# Patient Record
Sex: Male | Born: 2000 | Hispanic: No | Marital: Single | Smoking: Current every day smoker
Health system: Southern US, Community
[De-identification: ages and names within clinical notes are randomized; demographics above are authoritative.]

---
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
## 2022-08-02 ENCOUNTER — Emergency Department (HOSPITAL_COMMUNITY): Payer: Medicaid Other | Admitting: Anesthesiology

## 2022-08-02 ENCOUNTER — Encounter (HOSPITAL_BASED_OUTPATIENT_CLINIC_OR_DEPARTMENT_OTHER): Payer: Self-pay | Admitting: Emergency Medicine

## 2022-08-02 ENCOUNTER — Encounter (HOSPITAL_COMMUNITY)
Admission: EM | Discharge status: Against Medical Advice | Payer: Self-pay | Source: Home / Self Care | Attending: Emergency Medicine

## 2022-08-02 ENCOUNTER — Emergency Department (HOSPITAL_BASED_OUTPATIENT_CLINIC_OR_DEPARTMENT_OTHER): Payer: Medicaid Other

## 2022-08-02 ENCOUNTER — Ambulatory Visit (HOSPITAL_BASED_OUTPATIENT_CLINIC_OR_DEPARTMENT_OTHER)
Admission: EM | Admit: 2022-08-02 | Discharge: 2022-08-02 | Discharge status: Against Medical Advice | Payer: Medicaid Other | Attending: Emergency Medicine | Admitting: Emergency Medicine

## 2022-08-02 ENCOUNTER — Other Ambulatory Visit: Payer: Self-pay

## 2022-08-02 DIAGNOSIS — Z539 Procedure and treatment not carried out, unspecified reason: Secondary | ICD-10-CM | POA: Diagnosis not present

## 2022-08-02 DIAGNOSIS — F17210 Nicotine dependence, cigarettes, uncomplicated: Secondary | ICD-10-CM | POA: Diagnosis not present

## 2022-08-02 DIAGNOSIS — K353 Acute appendicitis with localized peritonitis, without perforation or gangrene: Secondary | ICD-10-CM | POA: Insufficient documentation

## 2022-08-02 DIAGNOSIS — K3580 Unspecified acute appendicitis: Secondary | ICD-10-CM | POA: Insufficient documentation

## 2022-08-02 LAB — COMPREHENSIVE METABOLIC PANEL
ALT: 25 U/L (ref 0–44)
AST: 20 U/L (ref 15–41)
Albumin: 4.7 g/dL (ref 3.5–5.0)
Alkaline Phosphatase: 97 U/L (ref 38–126)
Anion gap: 10 (ref 5–15)
BUN: 12 mg/dL (ref 6–20)
CO2: 24 mmol/L (ref 22–32)
Calcium: 9.5 mg/dL (ref 8.9–10.3)
Chloride: 103 mmol/L (ref 98–111)
Creatinine, Ser: 0.99 mg/dL (ref 0.61–1.24)
GFR, Estimated: >60 mL/min (ref >60)
Glucose, Bld: 120 mg/dL — ABNORMAL HIGH (ref 70–99)
Potassium: 3.9 mmol/L (ref 3.5–5.1)
Sodium: 137 mmol/L (ref 135–145)
Total Bilirubin: 1.1 mg/dL (ref 0.3–1.2)
Total Protein: 7.9 g/dL (ref 6.5–8.1)

## 2022-08-02 LAB — URINALYSIS, ROUTINE W REFLEX MICROSCOPIC
Bilirubin Urine: NEGATIVE
Glucose, UA: NEGATIVE mg/dL
Hgb urine dipstick: NEGATIVE
Ketones, ur: NEGATIVE mg/dL
Leukocytes,Ua: NEGATIVE
Nitrite: NEGATIVE
Protein, ur: 30 mg/dL — AB
Specific Gravity, Urine: <=1.005 (ref 1.005–1.030)
pH: 6 (ref 5.0–8.0)

## 2022-08-02 LAB — CBC
HCT: 43.7 % (ref 39.0–52.0)
Hemoglobin: 15.2 g/dL (ref 13.0–17.0)
MCH: 30.2 pg (ref 26.0–34.0)
MCHC: 34.8 g/dL (ref 30.0–36.0)
MCV: 86.7 fL (ref 80.0–100.0)
Platelets: 253 10*3/uL (ref 150–400)
RBC: 5.04 MIL/uL (ref 4.22–5.81)
RDW: 12.9 % (ref 11.5–15.5)
WBC: 21.4 10*3/uL — ABNORMAL HIGH (ref 4.0–10.5)
nRBC: 0 % (ref 0.0–0.2)

## 2022-08-02 LAB — URINALYSIS, MICROSCOPIC (REFLEX)

## 2022-08-02 LAB — LIPASE, BLOOD: Lipase: 25 U/L (ref 11–51)

## 2022-08-02 SURGERY — APPENDECTOMY, LAPAROSCOPIC
Anesthesia: General

## 2022-08-02 MED ORDER — ONDANSETRON HCL 4 MG/2ML IJ SOLN
4.0000 mg | Freq: Once | INTRAMUSCULAR | Status: AC
  Administered 2022-08-02: 4 mg via INTRAVENOUS
  Filled 2022-08-02: qty 2

## 2022-08-02 MED ORDER — IOHEXOL 300 MG/ML  SOLN
100.0000 mL | Freq: Once | INTRAMUSCULAR | Status: AC | PRN
  Administered 2022-08-02: 100 mL via INTRAVENOUS

## 2022-08-02 MED ORDER — GABAPENTIN 300 MG PO CAPS
ORAL_CAPSULE | ORAL | Status: AC
  Filled 2022-08-02: qty 1

## 2022-08-02 MED ORDER — FENTANYL CITRATE (PF) 250 MCG/5ML IJ SOLN
INTRAMUSCULAR | Status: AC
  Filled 2022-08-02: qty 5

## 2022-08-02 MED ORDER — SODIUM CHLORIDE 0.9 % IV SOLN: 2.0000 g | INTRAVENOUS | Status: DC

## 2022-08-02 MED ORDER — MORPHINE SULFATE (PF) 2 MG/ML IV SOLN
2.0000 mg | Freq: Once | INTRAVENOUS | Status: AC
  Administered 2022-08-02: 2 mg via INTRAVENOUS
  Filled 2022-08-02: qty 1

## 2022-08-02 MED ORDER — ACETAMINOPHEN 10 MG/ML IV SOLN
INTRAVENOUS | Status: AC
  Filled 2022-08-02: qty 100

## 2022-08-02 MED ORDER — METRONIDAZOLE 500 MG/100ML IV SOLN
500.0000 mg | Freq: Once | INTRAVENOUS | Status: AC
  Administered 2022-08-02: 500 mg via INTRAVENOUS
  Filled 2022-08-02: qty 100

## 2022-08-02 MED ORDER — SODIUM CHLORIDE 0.9 % IV SOLN: INTRAVENOUS | Status: DC | PRN

## 2022-08-02 MED ORDER — MORPHINE SULFATE (PF) 4 MG/ML IV SOLN
4.0000 mg | Freq: Once | INTRAVENOUS | Status: AC
  Administered 2022-08-02: 4 mg via INTRAVENOUS
  Filled 2022-08-02: qty 1

## 2022-08-02 MED ORDER — SODIUM CHLORIDE 0.9 % IV SOLN
2.0000 g | Freq: Once | INTRAVENOUS | Status: AC
  Administered 2022-08-02: 2 g via INTRAVENOUS
  Filled 2022-08-02: qty 20

## 2022-08-02 MED ORDER — LACTATED RINGERS IV BOLUS
1000.0000 mL | Freq: Once | INTRAVENOUS | Status: AC
  Administered 2022-08-02: 1000 mL via INTRAVENOUS

## 2022-08-02 MED ORDER — GABAPENTIN 300 MG PO CAPS: 300.0000 mg | ORAL_CAPSULE | ORAL | Status: DC

## 2022-08-02 MED ORDER — MIDAZOLAM HCL 2 MG/2ML IJ SOLN
INTRAMUSCULAR | Status: AC
  Filled 2022-08-02: qty 2

## 2022-08-02 MED ORDER — CHLORHEXIDINE GLUCONATE CLOTH 2 % EX PADS
6.0000 | MEDICATED_PAD | Freq: Once | CUTANEOUS | Status: DC
  Filled 2022-08-02: qty 6

## 2022-08-02 MED ORDER — ACETAMINOPHEN 500 MG PO TABS: 1000.0000 mg | ORAL_TABLET | ORAL | Status: DC

## 2022-08-02 SURGICAL SUPPLY — 52 items
APPLIER CLIP 5 13 M/L LIGAMAX5 (MISCELLANEOUS)
APPLIER CLIP ROT 10 11.4 M/L (STAPLE)
BAG COUNTER SPONGE SURGICOUNT (BAG) IMPLANT
CABLE HIGH FREQUENCY MONO STRZ (ELECTRODE) ×1 IMPLANT
CHLORAPREP W/TINT 26 (MISCELLANEOUS) ×1 IMPLANT
CLIP APPLIE 5 13 M/L LIGAMAX5 (MISCELLANEOUS) IMPLANT
CLIP APPLIE ROT 10 11.4 M/L (STAPLE) IMPLANT
COVER SURGICAL LIGHT HANDLE (MISCELLANEOUS) ×1 IMPLANT
DEVICE TROCAR PUNCTURE CLOSURE (ENDOMECHANICALS) IMPLANT
DRAPE LAPAROSCOPIC ABDOMINAL (DRAPES) ×1 IMPLANT
DRAPE WARM FLUID 44X44 (DRAPES) ×1 IMPLANT
DRSG TEGADERM 2-3/8X2-3/4 SM (GAUZE/BANDAGES/DRESSINGS) ×2 IMPLANT
DRSG TEGADERM 4X4.75 (GAUZE/BANDAGES/DRESSINGS) ×1 IMPLANT
ELECT REM PT RETURN 15FT ADLT (MISCELLANEOUS) ×1 IMPLANT
ENDOLOOP SUT PDS II  0 18 (SUTURE)
ENDOLOOP SUT PDS II 0 18 (SUTURE) IMPLANT
GAUZE SPONGE 2X2 STRL 8-PLY (GAUZE/BANDAGES/DRESSINGS) ×1 IMPLANT
GLOVE ECLIPSE 8.0 STRL XLNG CF (GLOVE) ×1 IMPLANT
GLOVE INDICATOR 8.0 STRL GRN (GLOVE) ×1 IMPLANT
GOWN STRL REUS W/ TWL XL LVL3 (GOWN DISPOSABLE) ×1 IMPLANT
GOWN STRL REUS W/TWL XL LVL3 (GOWN DISPOSABLE) ×1
IRRIG SUCT STRYKERFLOW 2 WTIP (MISCELLANEOUS) ×1
IRRIGATION SUCT STRKRFLW 2 WTP (MISCELLANEOUS) ×1 IMPLANT
KIT BASIN OR (CUSTOM PROCEDURE TRAY) ×1 IMPLANT
KIT TURNOVER KIT A (KITS) IMPLANT
NEEDLE INSUFFLATION 14GA 120MM (NEEDLE) IMPLANT
PAD POSITIONING PINK XL (MISCELLANEOUS) ×1 IMPLANT
PENCIL SMOKE EVACUATOR (MISCELLANEOUS) IMPLANT
POUCH RETRIEVAL ECOSAC 10 (ENDOMECHANICALS) ×1 IMPLANT
POUCH RETRIEVAL ECOSAC 10MM (ENDOMECHANICALS) ×1
RELOAD STAPLER BLUE 60MM (STAPLE) IMPLANT
RELOAD STAPLER GREEN 60MM (STAPLE) IMPLANT
SCISSORS LAP 5X35 DISP (ENDOMECHANICALS) ×1 IMPLANT
SEALER TISSUE G2 STRG ARTC 35C (ENDOMECHANICALS) IMPLANT
SET TUBE SMOKE EVAC HIGH FLOW (TUBING) ×1 IMPLANT
SLEEVE Z-THREAD 5X100MM (TROCAR) ×1 IMPLANT
SPIKE FLUID TRANSFER (MISCELLANEOUS) ×1 IMPLANT
STAPLE ECHEON FLEX 60 POW ENDO (STAPLE) IMPLANT
STAPLER RELOAD BLUE 60MM (STAPLE)
STAPLER RELOAD GREEN 60MM (STAPLE)
SUT MNCRL AB 4-0 PS2 18 (SUTURE) ×1 IMPLANT
SUT PDS AB 0 CT1 36 (SUTURE) IMPLANT
SUT PDS AB 1 CT1 27 (SUTURE) IMPLANT
SUT SILK 2 0 SH (SUTURE) IMPLANT
TOWEL OR 17X26 10 PK STRL BLUE (TOWEL DISPOSABLE) ×1 IMPLANT
TRAY FOLEY MTR SLVR 14FR STAT (SET/KITS/TRAYS/PACK) ×1 IMPLANT
TRAY FOLEY MTR SLVR 16FR STAT (SET/KITS/TRAYS/PACK) IMPLANT
TRAY LAPAROSCOPIC (CUSTOM PROCEDURE TRAY) ×1 IMPLANT
TROCAR ADV FIXATION 12X100MM (TROCAR) IMPLANT
TROCAR ADV FIXATION 5X100MM (TROCAR) ×1 IMPLANT
TROCAR Z THREAD OPTICAL 12X100 (TROCAR) IMPLANT
TROCAR Z-THREAD OPTICAL 5X100M (TROCAR) ×1 IMPLANT

## 2022-08-02 NOTE — H&P (Deleted)
Kyle Cortez  Feb 18, 2001 [id number]  CARE TEAM:  PCP: [facility] [place]  Outpatient Care Team: Patient Care Team: [facility] [place] as PCP - General (Pediatrics)  Inpatient Treatment Team: Treatment Team: Consulting Physician: [facility]cs, Md, MD   This patient is a 22 y.o.male who presents today for surgical evaluation at the request of Kyle Cortez, [place] at [facility] ED.   Chief complaint / Reason for evaluation: Acute appendicitis.  22 year old male with no prior major medical issues otherwise healthy.  Noticed sharp abdominal pain when he woke up at 5:00 morning.  Became more intense.  Markedly decreased appetite.  No definite fevers or chills but felt nauseated with some vomiting.  Became more intense.  Was worried he was constipated.  Tried a laxative.  Worsening cramping pain.  Went to [facility].  History physical CT scan suspicious for appendicitis.  Surgical consultation requested.  Need to be transferred to a hospital that has surgery.  Took some time for him to leave [facility].  Apparently they were giving antibiotics.  Then patient went to ER.  Finally came to short stay.   Assessment  Kyle Cortez  22 y.o. male  Day of Surgery  Procedure(s): APPENDECTOMY LAPAROSCOPIC  Problem List:  Active Problems:   Acute appendicitis   History physical and CAT scan strongly suspicious for appendicitis with duration of the differential diagnosis rather underwhelming  Plan:  Transfer to [facility] for surgical evaluation.  IV fluids.  Nausea pain control.  IV antibiotics.  Probable diagnostic laparoscopy with appendectomy.  T   7:30 PM ADDENDUM: Patient arrived at [facility] PACU (acting as short stay).  Went to the ER first.  I was informed that he was refusing surgery now.  Nursing, surgery team, myself, anesthesia in room.    Patient is refusing to have surgery unless he can go home and this can happen real quick.  I caution we usually  watch people overnight when it is getting later in the evening but it is a possibility he could go home.   patient notes he is "a grown ass man" and unless I can promise he can go home right away, he is refusing surgery.  I noted that while he is young and healthy and odds are that is probable that he could leave after surgery, but I cannot promise that he can go home right away necessarily.  Need to make sure it is safe.  Trying to be flexible but cautioned I cannot 100% promise anything.  He noted "I will just go home and come back tomorrow".    We tried to reassure him.  Offered to discuss with him further.  Patient avoiding eye contact.  Shuffling.  Not belligerent.  Does not seem to be delirious.  Eventually, he allowed me to examine him. His abdomen is somewhat rigid with some mild discomfort but he did not seem to be in unbearable peritonitis at this time.  Again explained the reasoning for the need for surgery for his appendicitis.  The sooner we can operate on him, the less risky the surgery will be and the greater likelihood that he can go home safely and recover safely.  He is now insistent that he is going home.  I noted he is going Kyle Cortez.  Apparently patient's mother and family in the car waiting.  Offered to discuss with them, but he noted he wants to go home.  OR staff, PACU nursing, anesthesia team  witnessing this discussion and interaction.  Patient signed paperwork and quickly left AMA before I could offer antibiotics or followup.  May be after the [employer] game, he will return to the ER.  We will see.        nursing notes, ED provider notes, last 24 h vitals and pain scores, last 48 h intake and output, last 24 h labs and trends, and last 24 h imaging resultsI reviewed nursing notes, ED provider notes, last 24 h vitals and pain scores, last 48 h intake and output, last 24 h labs and trends, and last 24 h imaging results. I have reviewed this patient's available data,  including medical history, events of note, test results, etc as part of my evaluation.  A significant portion of that time was spent in counseling.  Care during the described time interval was provided by me.  This care required moderate level of medical decision making.  08/02/2022  Adin Hector, MD, FACS, MASCRS Esophageal, Gastrointestinal & Colorectal Surgery Robotic and Minimally Invasive Surgery  [facility] [place] [id number], [place], [place] [address] [phone] Fax [phone] Main  CONTACT INFORMATION:  Weekday (9AM-5PM): Call [facility] main office at [phone]  Weeknight (5PM-9AM) or Weekend/Holiday: Check [email or web address] (password " [email or web address]") for General Surgery [facility] coverage  (Please, do not use SecureChat as it is not reliable communication to reach operating surgeons for immediate patient care given surgeries/outpatient duties/clinic/cross-coverage/off post-call which would lead to a delay in care.  Epic staff messaging available for outptient concerns, but may not be answered for 48 hours or more).     08/02/2022      History reviewed. No pertinent past medical history.  History reviewed. No pertinent surgical history.  Social History   Socioeconomic History   Marital status: Single    Spouse name: Not on file   Number of children: Not on file   Years of education: Not on file   Highest education level: Not on file  Occupational History   Not on file  Tobacco Use   Smoking status: Every Day    Types: Cigarettes    Passive exposure: Yes   Smokeless tobacco: Never  Substance and Sexual Activity   Alcohol use: No   Drug use: Yes    Types: Marijuana    Comment: last use 2 wks pta   Sexual activity: Not on file  Other Topics Concern   Not on file  Social History Narrative   Not on file   Social Determinants of Health   Financial Resource Strain: Not on file  Food Insecurity: Not on file   Transportation Needs: Not on file  Physical Activity: Not on file  Stress: Not on file  Social Connections: Not on file  Intimate Partner Violence: Not on file    History reviewed. No pertinent family history.  Current Facility-Administered Medications  Medication Dose Route Frequency Provider Last Rate Last Admin   [facility] Hold] 0.9 %  sodium chloride infusion   Intravenous PRN Lorelle Gibbs, DO 10 mL/hr at 08/02/22 1716 New Bag at 08/02/22 1716   [START ON 08/03/2022] acetaminophen (TYLENOL) tablet 1,000 mg  1,000 mg Oral On Call to OR Michael Boston, MD       [START ON 08/03/2022] cefTRIAXone (ROCEPHIN) 2 g in sodium chloride 0.9 % 100 mL IVPB  2 g Intravenous On Call to OR Michael Boston, MD       Chlorhexidine Gluconate Cloth 2 %  PADS 6 each  6 each Topical Once Michael Boston, MD       And   Chlorhexidine Gluconate Cloth 2 % PADS 6 each  6 each Topical Once Michael Boston, MD       Derrill Memo ON 08/03/2022] gabapentin (NEURONTIN) capsule 300 mg  300 mg Oral On Call to OR Michael Boston, MD         No Known Allergies  ROS:   All other systems reviewed & are negative except per HPI or as noted below: Constitutional:  No fevers, chills, sweats.  Weight stable Eyes:  No vision changes, No discharge HENT:  No sore throats, nasal drainage Lymph: No neck swelling, No bruising easily Pulmonary:  No cough, productive sputum CV: No orthopnea, PND  Patient walks 30 minutes without difficulty.  No exertional chest/neck/shoulder/arm pain.  GI:  No personal nor family history of GI/colon cancer, inflammatory bowel disease, irritable bowel syndrome, allergy such as Celiac Sprue, dietary/dairy problems, colitis, ulcers nor gastritis.  No recent sick contacts/gastroenteritis.  No travel outside the country.  No changes in diet.  Renal: No UTIs, No hematuria Genital:  No drainage, bleeding, masses Musculoskeletal: No severe joint pain.  Good ROM major joints Skin:  No sores or lesions Heme/Lymph:  No  easy bleeding.  No swollen lymph nodes   BP 107/61 (BP Location: Left Arm)   Pulse 71   Temp 99.3 F (37.4 C) (Oral)   Resp 16   Ht 5' 9"$  (1.753 m)   Wt 90.7 kg   SpO2 100%   BMI 29.53 kg/m   Physical Exam:  Constitutional: Not cachectic.  Hygeine adequate.  Vitals signs as above.  Standing.  Not toxic or sickly. Eyes: Pupils reactive, normal extraocular movements. Sclera nonicteric Neuro: CN II-XII intact.  No major focal sensory defects.  No major motor deficits. Lymph: No head/neck/groin lymphadenopathy Psych:  No severe agitation.  No severe anxiety.  Avoiding on contact.  A little restless and mattering.  Not belligerent or delirious.  Oriented x4, HENT: Normocephalic, Mucus membranes moist.  No thrush.   Neck: Supple, No tracheal deviation.  No obvious thyromegaly Chest: No pain to chest wall compression.  Good respiratory excursion.  No audible wheezing CV:  Pulses intact.  regular rhythm.  No major extremity edema  Abdomen:  Flat Hernia: Not present. Diastasis recti: Not present. Somewhat firm.   Nondistended.  Tenderness in lower abdomen right side.  No major guarding or peritonitis..  No hepatomegaly.  No splenomegaly  Gen:  Declines Rectal: (Deferred)  Ext: No obvious deformity or contracture.  Edema: Not present.  No cyanosis Skin: No major subcutaneous nodules.  Warm and dry Musculoskeletal: Severe joint rigidity not present.  No obvious clubbing.  No digital petechiae.     Results:   Labs: Results for orders placed or performed during the hospital encounter of 08/02/22 (from the past 48 hour(s))  Lipase, blood     Status: None   Collection Time: 08/02/22  1:17 PM  Result Value Ref Range   Lipase 25 11 - 51 U/L    Comment: Performed at [facility], [address] [place], [place] [address]  Comprehensive metabolic panel     Status: Abnormal   Collection Time: 08/02/22  1:17 PM  Result Value Ref Range   Sodium 137 135 - 145 mmol/L   Potassium  3.9 3.5 - 5.1 mmol/L   Chloride 103 98 - 111 mmol/L   CO2 24 22 - 32 mmol/L  Glucose, Bld 120 (H) 70 - 99 mg/dL    Comment: Glucose reference range applies only to samples taken after fasting for at least 8 hours.   BUN 12 6 - 20 mg/dL   Creatinine, Ser 0.99 0.61 - 1.24 mg/dL   Calcium 9.5 8.9 - 10.3 mg/dL   Total Protein 7.9 6.5 - 8.1 g/dL   Albumin 4.7 3.5 - 5.0 g/dL   AST 20 15 - 41 U/L   ALT 25 0 - 44 U/L   Alkaline Phosphatase 97 38 - 126 U/L   Total Bilirubin 1.1 0.3 - 1.2 mg/dL   GFR, Estimated >60 >60 mL/min    Comment: (NOTE) Calculated using the CKD-EPI Creatinine Equation (2021)    Anion gap 10 5 - 15    Comment: Performed at [facility], [address] [place], [place] [address]  CBC     Status: Abnormal   Collection Time: 08/02/22  1:17 PM  Result Value Ref Range   WBC 21.4 (H) 4.0 - 10.5 K/uL   RBC 5.04 4.22 - 5.81 MIL/uL   Hemoglobin 15.2 13.0 - 17.0 g/dL   HCT 43.7 39.0 - 52.0 %   MCV 86.7 80.0 - 100.0 fL   MCH 30.2 26.0 - 34.0 pg   MCHC 34.8 30.0 - 36.0 g/dL   RDW 12.9 11.5 - 15.5 %   Platelets 253 150 - 400 K/uL   nRBC 0.0 0.0 - 0.2 %    Comment: Performed at [facility], [address] [place], [place] [address]  Urinalysis, Routine w reflex microscopic -Urine, Clean Catch     Status: Abnormal   Collection Time: 08/02/22  1:17 PM  Result Value Ref Range   Color, Urine YELLOW YELLOW   APPearance CLEAR CLEAR   Specific Gravity, Urine <=1.005 1.005 - 1.030   pH 6.0 5.0 - 8.0   Glucose, UA NEGATIVE NEGATIVE mg/dL   Hgb urine dipstick NEGATIVE NEGATIVE   Bilirubin Urine NEGATIVE NEGATIVE   Ketones, ur NEGATIVE NEGATIVE mg/dL   Protein, ur 30 (A) NEGATIVE mg/dL   Nitrite NEGATIVE NEGATIVE   Leukocytes,Ua NEGATIVE NEGATIVE    Comment: Performed at [facility], [address] [place], [place] [address]  Urinalysis, Microscopic (reflex)     Status: Abnormal   Collection Time: 08/02/22  1:17 PM  Result Value Ref Range    RBC / HPF 0-5 0 - 5 RBC/hpf   WBC, UA 0-5 0 - 5 WBC/hpf   Bacteria, UA RARE (A) NONE SEEN   Squamous Epithelial / HPF 0-5 0 - 5 /HPF    Comment: Performed at [facility], [address] [place], [place] [address]    Imaging / Studies: CT ABDOMEN PELVIS W CONTRAST  Result Date: 08/02/2022 CLINICAL DATA:  Acute onset of right lower quadrant pain, vomiting, and diarrhea this morning. EXAM: CT ABDOMEN AND PELVIS WITH CONTRAST TECHNIQUE: Multidetector CT imaging of the abdomen and pelvis was performed using the standard protocol following bolus administration of intravenous contrast. RADIATION DOSE REDUCTION: This exam was performed according to the departmental dose-optimization program which includes automated exposure control, adjustment of the mA and/or kV according to patient size and/or use of iterative reconstruction technique. CONTRAST:  132m OMNIPAQUE IOHEXOL 300 MG/ML  SOLN COMPARISON:  None Available. FINDINGS: Lower Chest: No acute findings. Hepatobiliary: No hepatic masses identified. Gallbladder is unremarkable. No evidence of biliary ductal dilatation. Pancreas:  No mass or inflammatory changes. Spleen:  Within normal limits in size and appearance. Adrenals/Urinary Tract: No suspicious masses identified. No evidence of ureteral calculi or hydronephrosis. Stomach/Bowel:  Findings of acute appendicitis are seen as follows: Appendix: Location- Retrocecal Diameter-13 mm Appendicolith- Absent Mucosal hyper-enhancement- Present Extraluminal Gas- Absent Periappendiceal Collection-mild-to-moderate periappendiceal inflammatory changes are seen, however no abnormal fluid collections are present. Vascular/Lymphatic: No pathologically enlarged lymph nodes. No acute vascular findings. Reproductive:  No mass or other significant abnormality. Other:  None. Musculoskeletal:  No suspicious bone lesions identified. IMPRESSION: Positive for acute appendicitis. No evidence of abscess or other  complication. Electronically Signed   By: Marlaine Hind M.D.   On: 08/02/2022 14:41    Medications / Allergies: per chart  Antibiotics: Anti-infectives (From admission, onward)    Start     Dose/Rate Route Frequency Ordered Stop   08/03/22 0600  cefTRIAXone (ROCEPHIN) 2 g in sodium chloride 0.9 % 100 mL IVPB        2 g 200 mL/hr over 30 Minutes Intravenous On call to O.R. 08/02/22 1655 08/04/22 0559   08/02/22 1545  cefTRIAXone (ROCEPHIN) 2 g in sodium chloride 0.9 % 100 mL IVPB       See Hyperspace for full Linked Orders Report.   2 g 200 mL/hr over 30 Minutes Intravenous  Once 08/02/22 1540 08/02/22 1715   08/02/22 1545  metroNIDAZOLE (FLAGYL) IVPB 500 mg       See Hyperspace for full Linked Orders Report.   500 mg 100 mL/hr over 60 Minutes Intravenous  Once 08/02/22 1540 08/02/22 1736         Note: Portions of this report may have been transcribed using voice recognition software. Every effort was made to ensure accuracy; however, inadvertent computerized transcription errors may be present.   Any transcriptional errors that result from this process are unintentional.    Adin Hector, MD, FACS, MASCRS Esophageal, Gastrointestinal & Colorectal Surgery Robotic and Minimally Invasive Surgery  [facility] [address] [place], [place] [address] [phone] Fax [phone] Main  CONTACT INFORMATION:  Weekday (9AM-5PM): Call [facility] main office at [phone]  Weeknight (5PM-9AM) or Weekend/Holiday: Check [email or web address] (password " [email or web address]") for General Surgery [facility] coverage  (Please, do not use SecureChat as it is not reliable communication to reach operating surgeons for immediate patient care given surgeries/outpatient duties/clinic/cross-coverage/off post-call which would lead to a delay in care.  Epic staff messaging available for outptient concerns, but may not be answered for 48 hours or more).       08/02/2022  7:30 PM

## 2022-08-02 NOTE — ED Provider Notes (Signed)
[facility] [place] Provider Note   CSN: [id number] Arrival date & time: 08/02/22  1234     History  Chief Complaint  Patient presents with   Abdominal Pain    Kyle Cortez is a 22 y.o. male.   Abdominal Pain Patient is a 22 year old male with no medical problems no history of abdominal surgeries not on any anticoagulation presented emergency room today with complaints of right lower quadrant abdominal pain started this morning progressively worsened but improved at all today.  He has not eaten any food today.  He has not had any fevers he has had nausea, vomiting, has felt constipated felt that he needed to poop but has not been able to.  Denies any chest pain difficulty breathing no lightheadedness or dizziness.       Home Medications Prior to Admission medications   Medication Sig Start Date End Date Taking? Authorizing Provider  acetaminophen (TYLENOL) 325 MG tablet Take 2 tablets (650 mg total) every 6 (six) hours as needed by mouth. 05/06/17   Langston Masker B, PA-C  Camphor-Menthol-Methyl Sal 1.2-5.7-6.3 % PTCH Apply 1 patch daily topically. 05/06/17   Langston Masker B, PA-C  ibuprofen (ADVIL,MOTRIN) 400 MG tablet Take 1 tablet (400 mg total) every 6 (six) hours as needed by mouth. 05/06/17   Langston Masker B, PA-C      Allergies    Patient has no known allergies.    Review of Systems   Review of Systems  Gastrointestinal:  Positive for abdominal pain.    Physical Exam Updated Vital Signs BP 120/80   Pulse 70   Temp 98.2 F (36.8 C) (Oral)   Resp 18   Ht 5' 9"$  (1.753 m)   Wt 90.7 kg   SpO2 100%   BMI 29.53 kg/m  Physical Exam Vitals and nursing note reviewed.  Constitutional:      General: He is not in acute distress. HENT:     Head: Normocephalic and atraumatic.     Nose: Nose normal.  Eyes:     General: No scleral icterus. Cardiovascular:     Rate and Rhythm: Normal rate and regular rhythm.     Pulses:  Normal pulses.     Heart sounds: Normal heart sounds.  Pulmonary:     Effort: Pulmonary effort is normal. No respiratory distress.     Breath sounds: No wheezing.  Abdominal:     Palpations: Abdomen is soft.     Tenderness: There is abdominal tenderness in the right lower quadrant. Positive signs include McBurney's sign.     Comments: Positive Rovsing and McBurney, positive obturator sign  Musculoskeletal:     Cervical back: Normal range of motion.     Right lower leg: No edema.     Left lower leg: No edema.  Skin:    General: Skin is warm and dry.     Capillary Refill: Capillary refill takes less than 2 seconds.  Neurological:     Mental Status: He is alert. Mental status is at baseline.  Psychiatric:        Mood and Affect: Mood normal.        Behavior: Behavior normal.     ED Results / Procedures / Treatments   Labs (all labs ordered are listed, but only abnormal results are displayed) Labs Reviewed  COMPREHENSIVE METABOLIC PANEL - Abnormal; Notable for the following components:      Result Value   Glucose, Bld 120 (*)    All  other components within normal limits  CBC - Abnormal; Notable for the following components:   WBC 21.4 (*)    All other components within normal limits  LIPASE, BLOOD  URINALYSIS, ROUTINE W REFLEX MICROSCOPIC    EKG None  Radiology CT ABDOMEN PELVIS W CONTRAST  Result Date: 08/02/2022 CLINICAL DATA:  Acute onset of right lower quadrant pain, vomiting, and diarrhea this morning. EXAM: CT ABDOMEN AND PELVIS WITH CONTRAST TECHNIQUE: Multidetector CT imaging of the abdomen and pelvis was performed using the standard protocol following bolus administration of intravenous contrast. RADIATION DOSE REDUCTION: This exam was performed according to the departmental dose-optimization program which includes automated exposure control, adjustment of the mA and/or kV according to patient size and/or use of iterative reconstruction technique. CONTRAST:  117m  OMNIPAQUE IOHEXOL 300 MG/ML  SOLN COMPARISON:  None Available. FINDINGS: Lower Chest: No acute findings. Hepatobiliary: No hepatic masses identified. Gallbladder is unremarkable. No evidence of biliary ductal dilatation. Pancreas:  No mass or inflammatory changes. Spleen: Within normal limits in size and appearance. Adrenals/Urinary Tract: No suspicious masses identified. No evidence of ureteral calculi or hydronephrosis. Stomach/Bowel:  Findings of acute appendicitis are seen as follows: Appendix: Location- Retrocecal Diameter-13 mm Appendicolith- Absent Mucosal hyper-enhancement- Present Extraluminal Gas- Absent Periappendiceal Collection-mild-to-moderate periappendiceal inflammatory changes are seen, however no abnormal fluid collections are present. Vascular/Lymphatic: No pathologically enlarged lymph nodes. No acute vascular findings. Reproductive:  No mass or other significant abnormality. Other:  None. Musculoskeletal:  No suspicious bone lesions identified. IMPRESSION: Positive for acute appendicitis. No evidence of abscess or other complication. Electronically Signed   By: JMarlaine HindM.D.   On: 08/02/2022 14:41    Procedures Procedures    Medications Ordered in ED Medications  lactated ringers bolus 1,000 mL (has no administration in time range)  cefTRIAXone (ROCEPHIN) 2 g in sodium chloride 0.9 % 100 mL IVPB (has no administration in time range)    And  metroNIDAZOLE (FLAGYL) IVPB 500 mg (has no administration in time range)  morphine (PF) 4 MG/ML injection 4 mg (has no administration in time range)  ondansetron (ZOFRAN) injection 4 mg (has no administration in time range)  ondansetron (ZOFRAN) injection 4 mg (4 mg Intravenous Given 08/02/22 1344)  morphine (PF) 2 MG/ML injection 2 mg (2 mg Intravenous Given 08/02/22 1344)  iohexol (OMNIPAQUE) 300 MG/ML solution 100 mL (100 mLs Intravenous Contrast Given 08/02/22 1429)    ED Course/ Medical Decision Making/ A&P                              Medical Decision Making Amount and/or Complexity of Data Reviewed Labs: ordered.  Risk Prescription drug management. Decision regarding hospitalization.  This patient presents to the ED for concern of abdominal pain, this involves a number of treatment options, and is a complaint that carries with it a moderate risk of complications and morbidity. A differential diagnosis was considered for the patient's symptoms which is discussed below:   The causes of generalized abdominal pain include but are not limited to AAA, mesenteric ischemia, appendicitis, diverticulitis, DKA, gastritis, gastroenteritis, AMI, nephrolithiasis, pancreatitis, peritonitis, adrenal insufficiency,lead poisoning, iron toxicity, intestinal ischemia, constipation, UTI,SBO/LBO, splenic rupture, biliary disease, IBD, IBS, PUD, or hepatitis.   Co morbidities: Discussed in HPI   Brief History:  Patient is a 22year old male with no medical problems no history of abdominal surgeries not on any anticoagulation presented emergency room today with complaints of right lower quadrant abdominal pain  started this morning progressively worsened but improved at all today.  He has not eaten any food today.  He has not had any fevers he has had nausea, vomiting, has felt constipated felt that he needed to poop but has not been able to.  Denies any chest pain difficulty breathing no lightheadedness or dizziness.     EMR reviewed including pt PMHx, past surgical history and past visits to ER.   See HPI for more details   Lab Tests:   I ordered and independently interpreted labs. Labs notable for Leukocytosis of 21.4, CMP unremarkable lipase within normal limits urine pending  Imaging Studies:  Abnormal findings. I personally reviewed all imaging studies. Imaging notable for CT shows acute appendicitis no perforation or abscess   Cardiac Monitoring:  NA NA   Medicines ordered:  I ordered medication including  morphine, Zofran, fluids, Rocephin and Flagyl LR for hydration  Reevaluation of the patient after these medicines showed that the patient improved I have reviewed the patients home medicines and have made adjustments as needed   Critical Interventions:     Consults/Attending Physician   I requested consultation with gen surg,  and discussed lab and imaging findings as well as pertinent plan - they recommend: Dr. Johney Maine of general surgery recommends transfer to [facility] for surgery.   Reevaluation:  After the interventions noted above I re-evaluated patient and found that they have :improved   Social Determinants of Health:      Problem List / ED Course:  Acute appendicitis without perforation.  Dr. Johney Maine of general surgery will evaluate was alone.  Antibiotics initiated hydration and analgesia.   Dispostion:  After consideration of the diagnostic results and the patients response to treatment, I feel that the patent would benefit from evaluation by general surgery.    Final Clinical Impression(s) / ED Diagnoses Final diagnoses:  Acute appendicitis with localized peritonitis, without perforation, abscess, or gangrene    Rx / DC Orders ED Discharge Orders     None         Tedd Sias, [place] 08/02/22 1556    Lorelle Gibbs, DO 08/03/22 0026

## 2022-08-02 NOTE — Consult Note (Signed)
Kyle Cortez  Feb 05, 2001 [id number]  CARE TEAM:  PCP: [facility] [place]  Outpatient Care Team: Patient Care Team: [facility] [place] as PCP - General (Pediatrics)  Inpatient Treatment Team: Treatment Team: Consulting Physician: Ccs, Md, MD   This patient is a 22 y.o.male who presents today for surgical evaluation at the request of Pati Gallo, [place] at [facility] ED.   Chief complaint / Reason for evaluation: Acute appendicitis.  22 year old male with no prior major medical issues otherwise healthy.  Noticed sharp abdominal pain when he woke up at 5:00 morning.  Became more intense.  Markedly decreased appetite.  No definite fevers or chills but felt nauseated with some vomiting.  Became more intense.  Was worried he was constipated.  Tried a laxative.  Worsening cramping pain.  Went to [facility].  History physical CT scan suspicious for appendicitis.  Surgical consultation requested.  Need to be transferred to a hospital that has surgery.  Took some time for him to leave [place].  Apparently they were giving antibiotics.  Then patient went to ER.  Finally came to short stay.   Assessment  Kyle Cortez  22 y.o. male  Day of Surgery  Procedure(s): APPENDECTOMY LAPAROSCOPIC  Problem List:  Active Problems:   Acute appendicitis   History physical and CAT scan strongly suspicious for appendicitis with duration of the differential diagnosis rather underwhelming  Plan:  Transfer to [facility] for surgical evaluation.  IV fluids.  Nausea pain control.  IV antibiotics.  Probable diagnostic laparoscopy with appendectomy.  T   7:30 PM ADDENDUM: Patient arrived at [facility] PACU (acting as short stay).  Went to the ER first.  I was informed that he was refusing surgery now.  Nursing, surgery team, myself, anesthesia in room.    Patient is refusing to have surgery unless he can go home and this can happen real quick.  I caution we usually watch  people overnight when it is getting later in the evening but it is a possibility he could go home.   patient notes he is "a grown ass man" and unless I can promise he can go home right away, he is refusing surgery.  I noted that while he is young and healthy and odds are that is probable that he could leave after surgery, but I cannot promise that he can go home right away necessarily.  Need to make sure it is safe.  Trying to be flexible but cautioned I cannot 100% promise anything.  He noted "I will just go home and come back tomorrow".    We tried to reassure him.  Offered to discuss with him further.  Patient avoiding eye contact.  Shuffling.  Not belligerent.  Does not seem to be delirious.  Eventually, he allowed me to examine him. His abdomen is somewhat rigid with some mild discomfort but he did not seem to be in unbearable peritonitis at this time.  Again explained the reasoning for the need for surgery for his appendicitis.  The sooner we can operate on him, the less risky the surgery will be and the greater likelihood that he can go home safely and recover safely.  He is now insistent that he is going home.  I noted he is going [place].  Apparently patient's mother and family in the car waiting.  Offered to discuss with them, but he noted he wants to go home.  OR staff, PACU nursing, anesthesia team witnessing  this discussion and interaction.  Patient signed paperwork and quickly left AMA before I could offer antibiotics or followup.  May be after the [employer] game, he will return to the ER.  We will see.        nursing notes, ED provider notes, last 24 h vitals and pain scores, last 48 h intake and output, last 24 h labs and trends, and last 24 h imaging resultsI reviewed nursing notes, ED provider notes, last 24 h vitals and pain scores, last 48 h intake and output, last 24 h labs and trends, and last 24 h imaging results. I have reviewed this patient's available data,  including medical history, events of note, test results, etc as part of my evaluation.  A significant portion of that time was spent in counseling.  Care during the described time interval was provided by me.  This care required moderate level of medical decision making.  08/02/2022  Adin Hector, MD, FACS, MASCRS Esophageal, Gastrointestinal & Colorectal Surgery Robotic and Minimally Invasive Surgery  [facility] [place] [id number], [place],  [address] [phone] Fax [phone] Main  CONTACT INFORMATION:  Weekday (9AM-5PM): Call [facility] main office at [phone]  Weeknight (5PM-9AM) or Weekend/Holiday: Check [email or web address] (password " [email or web address]") for General Surgery [facility] coverage  (Please, do not use SecureChat as it is not reliable communication to reach operating surgeons for immediate patient care given surgeries/outpatient duties/clinic/cross-coverage/off post-call which would lead to a delay in care.  Epic staff messaging available for outptient concerns, but may not be answered for 48 hours or more).     08/02/2022      History reviewed. No pertinent past medical history.  History reviewed. No pertinent surgical history.  Social History   Socioeconomic History   Marital status: Single    Spouse name: Not on file   Number of children: Not on file   Years of education: Not on file   Highest education level: Not on file  Occupational History   Not on file  Tobacco Use   Smoking status: Every Day    Types: Cigarettes    Passive exposure: Yes   Smokeless tobacco: Never  Substance and Sexual Activity   Alcohol use: No   Drug use: Yes    Types: Marijuana    Comment: last use 2 wks pta   Sexual activity: Not on file  Other Topics Concern   Not on file  Social History Narrative   Not on file   Social Determinants of Health   Financial Resource Strain: Not on file  Food Insecurity: Not on file   Transportation Needs: Not on file  Physical Activity: Not on file  Stress: Not on file  Social Connections: Not on file  Intimate Partner Violence: Not on file    History reviewed. No pertinent family history.  Current Facility-Administered Medications  Medication Dose Route Frequency Provider Last Rate Last Admin   [facility] Hold] 0.9 %  sodium chloride infusion   Intravenous PRN Lorelle Gibbs, DO 10 mL/hr at 08/02/22 1716 New Bag at 08/02/22 1716   [START ON 08/03/2022] acetaminophen (TYLENOL) tablet 1,000 mg  1,000 mg Oral On Call to OR Michael Boston, MD       [START ON 08/03/2022] cefTRIAXone (ROCEPHIN) 2 g in sodium chloride 0.9 % 100 mL IVPB  2 g Intravenous On Call to OR Michael Boston, MD       Chlorhexidine Gluconate Cloth 2 %  PADS 6 each  6 each Topical Once Michael Boston, MD       And   Chlorhexidine Gluconate Cloth 2 % PADS 6 each  6 each Topical Once Michael Boston, MD       Derrill Memo ON 08/03/2022] gabapentin (NEURONTIN) capsule 300 mg  300 mg Oral On Call to OR Michael Boston, MD         No Known Allergies  ROS:   All other systems reviewed & are negative except per HPI or as noted below: Constitutional:  No fevers, chills, sweats.  Weight stable Eyes:  No vision changes, No discharge HENT:  No sore throats, nasal drainage Lymph: No neck swelling, No bruising easily Pulmonary:  No cough, productive sputum CV: No orthopnea, PND  Patient walks 30 minutes without difficulty.  No exertional chest/neck/shoulder/arm pain.  GI:  No personal nor family history of GI/colon cancer, inflammatory bowel disease, irritable bowel syndrome, allergy such as Celiac Sprue, dietary/dairy problems, colitis, ulcers nor gastritis.  No recent sick contacts/gastroenteritis.  No travel outside the country.  No changes in diet.  Renal: No UTIs, No hematuria Genital:  No drainage, bleeding, masses Musculoskeletal: No severe joint pain.  Good ROM major joints Skin:  No sores or lesions Heme/Lymph:  No  easy bleeding.  No swollen lymph nodes   BP 107/61 (BP Location: Left Arm)   Pulse 71   Temp 99.3 F (37.4 C) (Oral)   Resp 16   Ht 5' 9"$  (1.753 m)   Wt 90.7 kg   SpO2 100%   BMI 29.53 kg/m   Physical Exam:  Constitutional: Not cachectic.  Hygeine adequate.  Vitals signs as above.  Standing.  Not toxic or sickly. Eyes: Pupils reactive, normal extraocular movements. Sclera nonicteric Neuro: CN II-XII intact.  No major focal sensory defects.  No major motor deficits. Lymph: No head/neck/groin lymphadenopathy Psych:  No severe agitation.  No severe anxiety.  Avoiding on contact.  A little restless and mattering.  Not belligerent or delirious.  Oriented x4, HENT: Normocephalic, Mucus membranes moist.  No thrush.   Neck: Supple, No tracheal deviation.  No obvious thyromegaly Chest: No pain to chest wall compression.  Good respiratory excursion.  No audible wheezing CV:  Pulses intact.  regular rhythm.  No major extremity edema  Abdomen:  Flat Hernia: Not present. Diastasis recti: Not present. Somewhat firm.   Nondistended.  Tenderness in lower abdomen right side.  No major guarding or peritonitis..  No hepatomegaly.  No splenomegaly  Gen:  Declines Rectal: (Deferred)  Ext: No obvious deformity or contracture.  Edema: Not present.  No cyanosis Skin: No major subcutaneous nodules.  Warm and dry Musculoskeletal: Severe joint rigidity not present.  No obvious clubbing.  No digital petechiae.     Results:   Labs: Results for orders placed or performed during the hospital encounter of 08/02/22 (from the past 48 hour(s))  Lipase, blood     Status: None   Collection Time: 08/02/22  1:17 PM  Result Value Ref Range   Lipase 25 11 - 51 U/L    Comment: Performed at [facility], [address] [place], [place] [address]  Comprehensive metabolic panel     Status: Abnormal   Collection Time: 08/02/22  1:17 PM  Result Value Ref Range   Sodium 137 135 - 145 mmol/L   Potassium  3.9 3.5 - 5.1 mmol/L   Chloride 103 98 - 111 mmol/L   CO2 24 22 - 32 mmol/L  Glucose, Bld 120 (H) 70 - 99 mg/dL    Comment: Glucose reference range applies only to samples taken after fasting for at least 8 hours.   BUN 12 6 - 20 mg/dL   Creatinine, Ser 0.99 0.61 - 1.24 mg/dL   Calcium 9.5 8.9 - 10.3 mg/dL   Total Protein 7.9 6.5 - 8.1 g/dL   Albumin 4.7 3.5 - 5.0 g/dL   AST 20 15 - 41 U/L   ALT 25 0 - 44 U/L   Alkaline Phosphatase 97 38 - 126 U/L   Total Bilirubin 1.1 0.3 - 1.2 mg/dL   GFR, Estimated >60 >60 mL/min    Comment: (NOTE) Calculated using the CKD-EPI Creatinine Equation (2021)    Anion gap 10 5 - 15    Comment: Performed at [facility], [address] [place], [place] [address]  CBC     Status: Abnormal   Collection Time: 08/02/22  1:17 PM  Result Value Ref Range   WBC 21.4 (H) 4.0 - 10.5 K/uL   RBC 5.04 4.22 - 5.81 MIL/uL   Hemoglobin 15.2 13.0 - 17.0 g/dL   HCT 43.7 39.0 - 52.0 %   MCV 86.7 80.0 - 100.0 fL   MCH 30.2 26.0 - 34.0 pg   MCHC 34.8 30.0 - 36.0 g/dL   RDW 12.9 11.5 - 15.5 %   Platelets 253 150 - 400 K/uL   nRBC 0.0 0.0 - 0.2 %    Comment: Performed at [facility], [address] [place], [place] [address]  Urinalysis, Routine w reflex microscopic -Urine, Clean Catch     Status: Abnormal   Collection Time: 08/02/22  1:17 PM  Result Value Ref Range   Color, Urine YELLOW YELLOW   APPearance CLEAR CLEAR   Specific Gravity, Urine <=1.005 1.005 - 1.030   pH 6.0 5.0 - 8.0   Glucose, UA NEGATIVE NEGATIVE mg/dL   Hgb urine dipstick NEGATIVE NEGATIVE   Bilirubin Urine NEGATIVE NEGATIVE   Ketones, ur NEGATIVE NEGATIVE mg/dL   Protein, ur 30 (A) NEGATIVE mg/dL   Nitrite NEGATIVE NEGATIVE   Leukocytes,Ua NEGATIVE NEGATIVE    Comment: Performed at [facility], [address] [place], [place] [address]  Urinalysis, Microscopic (reflex)     Status: Abnormal   Collection Time: 08/02/22  1:17 PM  Result Value Ref Range    RBC / HPF 0-5 0 - 5 RBC/hpf   WBC, UA 0-5 0 - 5 WBC/hpf   Bacteria, UA RARE (A) NONE SEEN   Squamous Epithelial / HPF 0-5 0 - 5 /HPF    Comment: Performed at [facility], [address] [place], [place] [address]    Imaging / Studies: CT ABDOMEN PELVIS W CONTRAST  Result Date: 08/02/2022 CLINICAL DATA:  Acute onset of right lower quadrant pain, vomiting, and diarrhea this morning. EXAM: CT ABDOMEN AND PELVIS WITH CONTRAST TECHNIQUE: Multidetector CT imaging of the abdomen and pelvis was performed using the standard protocol following bolus administration of intravenous contrast. RADIATION DOSE REDUCTION: This exam was performed according to the departmental dose-optimization program which includes automated exposure control, adjustment of the mA and/or kV according to patient size and/or use of iterative reconstruction technique. CONTRAST:  178m OMNIPAQUE IOHEXOL 300 MG/ML  SOLN COMPARISON:  None Available. FINDINGS: Lower Chest: No acute findings. Hepatobiliary: No hepatic masses identified. Gallbladder is unremarkable. No evidence of biliary ductal dilatation. Pancreas:  No mass or inflammatory changes. Spleen:  Within normal limits in size and appearance. Adrenals/Urinary Tract: No suspicious masses identified. No evidence of ureteral calculi or hydronephrosis. Stomach/Bowel:  Findings of acute appendicitis are seen as follows: Appendix: Location- Retrocecal Diameter-13 mm Appendicolith- Absent Mucosal hyper-enhancement- Present Extraluminal Gas- Absent Periappendiceal Collection-mild-to-moderate periappendiceal inflammatory changes are seen, however no abnormal fluid collections are present. Vascular/Lymphatic: No pathologically enlarged lymph nodes. No acute vascular findings. Reproductive:  No mass or other significant abnormality. Other:  None. Musculoskeletal:  No suspicious bone lesions identified. IMPRESSION: Positive for acute appendicitis. No evidence of abscess or other  complication. Electronically Signed   By: Marlaine Hind M.D.   On: 08/02/2022 14:41    Medications / Allergies: per chart  Antibiotics: Anti-infectives (From admission, onward)    Start     Dose/Rate Route Frequency Ordered Stop   08/03/22 0600  cefTRIAXone (ROCEPHIN) 2 g in sodium chloride 0.9 % 100 mL IVPB        2 g 200 mL/hr over 30 Minutes Intravenous On call to O.R. 08/02/22 1655 08/04/22 0559   08/02/22 1545  cefTRIAXone (ROCEPHIN) 2 g in sodium chloride 0.9 % 100 mL IVPB       See Hyperspace for full Linked Orders Report.   2 g 200 mL/hr over 30 Minutes Intravenous  Once 08/02/22 1540 08/02/22 1715   08/02/22 1545  metroNIDAZOLE (FLAGYL) IVPB 500 mg       See Hyperspace for full Linked Orders Report.   500 mg 100 mL/hr over 60 Minutes Intravenous  Once 08/02/22 1540 08/02/22 1736         Note: Portions of this report may have been transcribed using voice recognition software. Every effort was made to ensure accuracy; however, inadvertent computerized transcription errors may be present.   Any transcriptional errors that result from this process are unintentional.    Adin Hector, MD, FACS, MASCRS Esophageal, Gastrointestinal & Colorectal Surgery Robotic and Minimally Invasive Surgery  [facility]. [address], [place], [place] [address] [phone] Fax [phone] Main  CONTACT INFORMATION:  Weekday (9AM-5PM): Call [facility] main office at [phone]  Weeknight (5PM-9AM) or Weekend/Holiday: Check [email or web address] (password " [email or web address]") for General Surgery [facility] coverage  (Please, do not use SecureChat as it is not reliable communication to reach operating surgeons for immediate patient care given surgeries/outpatient duties/clinic/cross-coverage/off post-call which would lead to a delay in care.  Epic staff messaging available for outptient concerns, but may not be answered for 48 hours or more).       08/02/2022  7:30 PM

## 2022-08-02 NOTE — Anesthesia Preprocedure Evaluation (Signed)
Anesthesia Evaluation    Reviewed: Allergy & Precautions, Patient's Chart, lab work & pertinent test results  Airway        Dental   Pulmonary Current Smoker          Cardiovascular negative cardio ROS      Neuro/Psych negative neurological ROS     GI/Hepatic Neg liver ROS,,,ACUTE APPENDICITIS   Endo/Other  negative endocrine ROS    Renal/GU negative Renal ROS     Musculoskeletal negative musculoskeletal ROS (+)    Abdominal   Peds  Hematology negative hematology ROS (+)   Anesthesia Other Findings   Reproductive/Obstetrics                             Anesthesia Physical Anesthesia Plan Anesthesia Quick Evaluation

## 2022-08-02 NOTE — ED Notes (Addendum)
Pt waiting for his mom to come back and get him to take him to the [facility] ER, GF has called the mom

## 2022-08-02 NOTE — ED Triage Notes (Signed)
Pt arrives pov, slow gait with c/o abd pain and n/v/d since 0500 today. Endorses lax 1 hr pta. Pt demanding to be seen, states pain is unbearable.

## 2022-08-02 NOTE — ED Notes (Addendum)
Girlfriend in room , pt states understanding NOT to stop , NOT to eat or drink anything and  go straight to [facility] ER, IV was secured and covered,  pt understands NOT to take out IV still waiting for his mom

## 2022-08-02 NOTE — ED Notes (Signed)
Per OR, pts flagyl and LR is to be d/c'd and pt is to reports to OR asap

## 2022-08-02 NOTE — Progress Notes (Signed)
Patient arrived to preop at 1856.  Patient began to undress and blood pressure cuff was applied and started putting EKG leads on patients chest when we began to discuss staying over night etc and the patient became irate and pulling things off of his chest, removed blood pressure cuff and began to put his sweat shirt back on.  Dr. Johney Maine and MDA at bedside.  Dr. Johney Maine tried to reason with patient and explain consequences of delaying surgery but the patient continued to refuse care.  Patient signed AMA form, IV was removed from [facility], patient was placed back in a wheel chair with all belongings.  Patient called family and OR RN took patient back to ED.  Patient departed PACU department at 1906.
